# Patient Record
Sex: Male | Born: 1960 | Race: Black or African American | Hispanic: No | Marital: Single | Smoking: Never smoker
Health system: Southern US, Community
[De-identification: ages and names within clinical notes are randomized; demographics above are authoritative.]

---

## 2020-05-17 ENCOUNTER — Emergency Department (HOSPITAL_BASED_OUTPATIENT_CLINIC_OR_DEPARTMENT_OTHER): Payer: Self-pay

## 2020-05-17 ENCOUNTER — Other Ambulatory Visit: Payer: Self-pay

## 2020-05-17 ENCOUNTER — Encounter (HOSPITAL_BASED_OUTPATIENT_CLINIC_OR_DEPARTMENT_OTHER): Payer: Self-pay

## 2020-05-17 ENCOUNTER — Emergency Department (HOSPITAL_BASED_OUTPATIENT_CLINIC_OR_DEPARTMENT_OTHER)
Admission: EM | Admit: 2020-05-17 | Discharge: 2020-05-17 | Disposition: A | Discharge status: Home / Self Care | Payer: Self-pay | Attending: Emergency Medicine | Admitting: Emergency Medicine

## 2020-05-17 DIAGNOSIS — Z79899 Other long term (current) drug therapy: Secondary | ICD-10-CM | POA: Insufficient documentation

## 2020-05-17 DIAGNOSIS — M25512 Pain in left shoulder: Secondary | ICD-10-CM | POA: Insufficient documentation

## 2020-05-17 DIAGNOSIS — Z20822 Contact with and (suspected) exposure to covid-19: Secondary | ICD-10-CM

## 2020-05-17 DIAGNOSIS — U071 COVID-19: Secondary | ICD-10-CM | POA: Insufficient documentation

## 2020-05-17 DIAGNOSIS — W000XXA Fall on same level due to ice and snow, initial encounter: Secondary | ICD-10-CM | POA: Insufficient documentation

## 2020-05-17 DIAGNOSIS — S20212A Contusion of left front wall of thorax, initial encounter: Secondary | ICD-10-CM | POA: Insufficient documentation

## 2020-05-17 DIAGNOSIS — W19XXXA Unspecified fall, initial encounter: Secondary | ICD-10-CM

## 2020-05-17 DIAGNOSIS — R509 Fever, unspecified: Secondary | ICD-10-CM | POA: Insufficient documentation

## 2020-05-17 MED ORDER — ACETAMINOPHEN 325 MG PO TABS: 650.0000 mg | ORAL_TABLET | Freq: Once | ORAL | Status: DC

## 2020-05-17 MED ORDER — METHOCARBAMOL 500 MG PO TABS: 500.0000 mg | ORAL_TABLET | Freq: Two times a day (BID) | ORAL | 0 refills | Status: AC

## 2020-05-17 MED ORDER — IBUPROFEN 400 MG PO TABS
600.0000 mg | ORAL_TABLET | Freq: Once | ORAL | Status: AC
  Administered 2020-05-17: 600 mg via ORAL
  Filled 2020-05-17: qty 1

## 2020-05-17 MED ORDER — ACETAMINOPHEN 500 MG PO TABS
1000.0000 mg | ORAL_TABLET | Freq: Once | ORAL | Status: AC
  Administered 2020-05-17: 1000 mg via ORAL
  Filled 2020-05-17: qty 2

## 2020-05-17 NOTE — ED Notes (Signed)
RN Yvonna Alanis informed of pt temp 101.2

## 2020-05-17 NOTE — ED Notes (Signed)
PA at bedside.

## 2020-05-17 NOTE — Discharge Instructions (Addendum)
You have been diagnosed with a contusion/hematoma this is basically an area of swelling and pain after a fall.  Please drink plenty of water.  Take Tylenol and ibuprofen for pain as well as fever.  I have also provided you with a muscle relaxer to use as needed. Please follow up with a primary care doctor/office.   Your examination today is most concerning for a muscular injury 1. Medications: alternate ibuprofen and tylenol for pain control, take all usual home medications as they are prescribed 2. Treatment: rest, ice, elevate and use an ACE wrap or other compressive therapy to decrease swelling. Also drink plenty of fluids and do plenty of gentle stretching and move the affected muscle through its normal range of motion to prevent stiffness. 3. Follow Up: If your symptoms do not improve please follow up with orthopedics/sports medicine or your PCP for discussion of your diagnoses and further evaluation after today's visit; if you do not have a primary care doctor use the resource guide provided to find one; Please return to the ER for worsening symptoms or other concerns.

## 2020-05-17 NOTE — ED Provider Notes (Signed)
[facility] EMERGENCY DEPARTMENT Provider Note   CSN: [id number] Arrival date & time: 05/17/20  1734     History Chief Complaint  Patient presents with  . Fall    Jordan Leon is a 60 y.o. male.  HPI Patient is a 60 year old male presented today with left-sided rib cage pain, left shoulder pain after a fall that occurred yesterday evening when he was walking home from work he states that he was walking on black ice and his feet slipped forward underneath him and he fell backwards.  He states that he fell primarily onto his left thorax.  He states that there was no sharp objects or irregular surfaces that he fell upon.  He did not hit his head or lose consciousness.  He denies any back pain but states that he has severe achy nonradiating left-sided thorax pain that is constant worse with touch and turning and twisting.  He states that he works both at [employer] in [place] and he has had difficulty working because of the pain.  He denies any other symptoms but was noted to have a temperature of 101.2.  He states he has not been coughing he has not had any congestion nausea vomiting diarrhea he denies any headaches and states that he had no fatigue or weakness contributing to his fall today.  He also denies any chest pain or shortness of breath.  No fatigue or malaise.  No chills.    History reviewed. No pertinent past medical history.  There are no problems to display for this patient.   History reviewed. No pertinent surgical history.     No family history on file.  Social History   Tobacco Use  . Smoking status: Never Smoker  . Smokeless tobacco: Never Used  Substance Use Topics  . Alcohol use: Yes    Comment: occ  . Drug use: Never    Home Medications Prior to Admission medications   Medication Sig Start Date End Date Taking? Authorizing Provider  methocarbamol (ROBAXIN) 500 MG tablet Take 1 tablet (500 mg total) by mouth 2 (two) times daily. 05/17/20   Yes Gailen Shelter, PA    Allergies    Patient has no known allergies.  Review of Systems   Review of Systems  Constitutional: Negative for chills and fever (Specifically denies).  HENT: Negative for congestion.   Eyes: Negative for pain.  Respiratory: Negative for cough and shortness of breath.   Cardiovascular: Negative for chest pain and leg swelling.  Gastrointestinal: Negative for abdominal pain and vomiting.  Genitourinary: Negative for dysuria.  Musculoskeletal: Negative for myalgias.       Left-sided rib cage pain and left shoulder pain  Skin: Negative for rash.  Neurological: Negative for dizziness and headaches.    Physical Exam Updated Vital Signs BP 134/80 (BP Location: Right Arm)   Pulse 72   Temp (!) 101.2 F (38.4 C) (Oral)   Resp 14   Ht 5\' 9"  (1.753 m)   Wt 63.5 kg   SpO2 95%   BMI 20.67 kg/m   Physical Exam Vitals and nursing note reviewed.  Constitutional:      General: He is not in acute distress.    Appearance: Normal appearance. He is not ill-appearing.  HENT:     Head: Normocephalic and atraumatic.  Eyes:     General: No scleral icterus.       Right eye: No discharge.        Left eye: No discharge.  Conjunctiva/sclera: Conjunctivae normal.  Pulmonary:     Effort: Pulmonary effort is normal.     Breath sounds: No stridor.  Skin:    Comments: There is tenderness to palpation diffusely along the left lateral rib cage approximately ribs #5-7.  No significant bruising.  There is also some diffuse left shoulder tenderness to palpation over the musculature.  Full range of motion of bilateral grip strength 5/5.  No other bony tenderness over joints or long bones of the upper and lower extremities.     No neck or back midline tenderness, step-off, deformity, or bruising. Able to turn head left and right 45 degrees without difficulty.  Full range of motion of upper and lower extremity joints shown after palpation was conducted; with 5/5  symmetrical strength in upper and lower extremities. No chest wall tenderness, no facial or cranial tenderness.   Patient has intact sensation grossly in lower and upper extremities. Intact patellar and ankle reflexes. Patient able to ambulate without difficulty.  Radial and DP pulses palpated BL.   Neurological:     Mental Status: He is alert and oriented to person, place, and time. Mental status is at baseline.     ED Results / Procedures / Treatments   Labs (all labs ordered are listed, but only abnormal results are displayed) Labs Reviewed  SARS CORONAVIRUS 2 (TAT 6-24 HRS)    EKG None  Radiology DG Ribs Unilateral W/Chest Left  Result Date: 05/17/2020 CLINICAL DATA:  Status post fall. EXAM: LEFT RIBS AND CHEST - 3+ VIEW COMPARISON:  None. FINDINGS: A radiopaque marker was placed at the site of the patient's pain. No fracture or other bone lesions are seen involving the ribs. There is no evidence of pneumothorax or pleural effusion. A trace amount of atelectasis is seen within the bilateral lung bases. Heart size and mediastinal contours are within normal limits. IMPRESSION: No rib fracture or other bone lesions are identified. Electronically Signed   By: Aram Candela M.D.   On: 05/17/2020 18:55   DG Shoulder Left  Result Date: 05/17/2020 CLINICAL DATA:  Status post fall. EXAM: LEFT SHOULDER - 2+ VIEW COMPARISON:  None. FINDINGS: There is no evidence of fracture or dislocation. There is no evidence of arthropathy or other focal bone abnormality. Soft tissues are unremarkable. IMPRESSION: Negative. Electronically Signed   By: Aram Candela M.D.   On: 05/17/2020 18:54   DG Hip Unilat W or Wo Pelvis 2-3 Views Left  Result Date: 05/17/2020 CLINICAL DATA:  Status post fall. EXAM: DG HIP (WITH OR WITHOUT PELVIS) 2-3V LEFT COMPARISON:  None. FINDINGS: There is no evidence of hip fracture or dislocation. There is no evidence of arthropathy or other focal bone abnormality. Multiple  radiopaque surgical clips are seen within the pelvis. IMPRESSION: Negative. Electronically Signed   By: Aram Candela M.D.   On: 05/17/2020 18:56    Procedures Procedures (including critical care time)  Medications Ordered in ED Medications  acetaminophen (TYLENOL) tablet 1,000 mg (1,000 mg Oral Given 05/17/20 2200)  ibuprofen (ADVIL) tablet 600 mg (600 mg Oral Given 05/17/20 2252)    ED Course  I have reviewed the triage vital signs and the nursing notes.  Pertinent labs & imaging results that were available during my care of the patient were reviewed by me and considered in my medical decision making (see chart for details).    MDM Rules/Calculators/A&P  Patient is 60 year old male presenting today after fall that occurred yesterday evening.  He states he slipped on ice.  Fell onto his left thorax.  Has reproducible tenderness to palpation of the left-sided thorax.  He had no symptoms prior to falling.  He was noted in triage to have a temperature of nine 99.7 on recheck was 101.2.  He is not tachycardic denies any symptoms of fevers, chills, nausea, vomiting, cough, congestion.  Overall is very well-appearing.  He states he is vaccinated x2 for COVID.  He received Tylenol for his fever and states that he feels that his rib cage pain is somewhat improved.  He is ambulatory and well-appearing at this time vital signs within normal limits apart from elevated temperature we will recheck prior to discharge.  We will provide patient with a dose of ibuprofen and recommend Tylenol and Profen at home for fever and pain as well as muscle relaxer.  Patient is overall very well-appearing.  No indication for further work-up at this time.  Final Clinical Impression(s) / ED Diagnoses Final diagnoses:  Fall, initial encounter  Fever, unspecified fever cause  Rib contusion, left, initial encounter  Suspected COVID-19 virus infection    Rx / DC Orders ED Discharge  Orders         Ordered    methocarbamol (ROBAXIN) 500 MG tablet  2 times daily        05/17/20 2245           Solon Augusta Ursa, [place] 05/17/20 2255    Vanetta Mulders, MD 05/19/20 1857

## 2020-05-17 NOTE — ED Triage Notes (Signed)
Pt states he slipped on ice/fell last night-pain to left shoulder, left rib area, left hip-ambulatory to ED-to triage in w/c

## 2020-05-18 LAB — SARS CORONAVIRUS 2 (TAT 6-24 HRS): SARS Coronavirus 2: POSITIVE — AB

## 2021-06-02 IMAGING — CR DG HIP (WITH OR WITHOUT PELVIS) 2-3V*L*
3 series · 3 of 3 positions shown · non-contrast
Comparison: None.

CLINICAL DATA: Status post fall.

EXAM:
DG HIP (WITH OR WITHOUT PELVIS) 2-3V LEFT

[t pelvis a.p.]
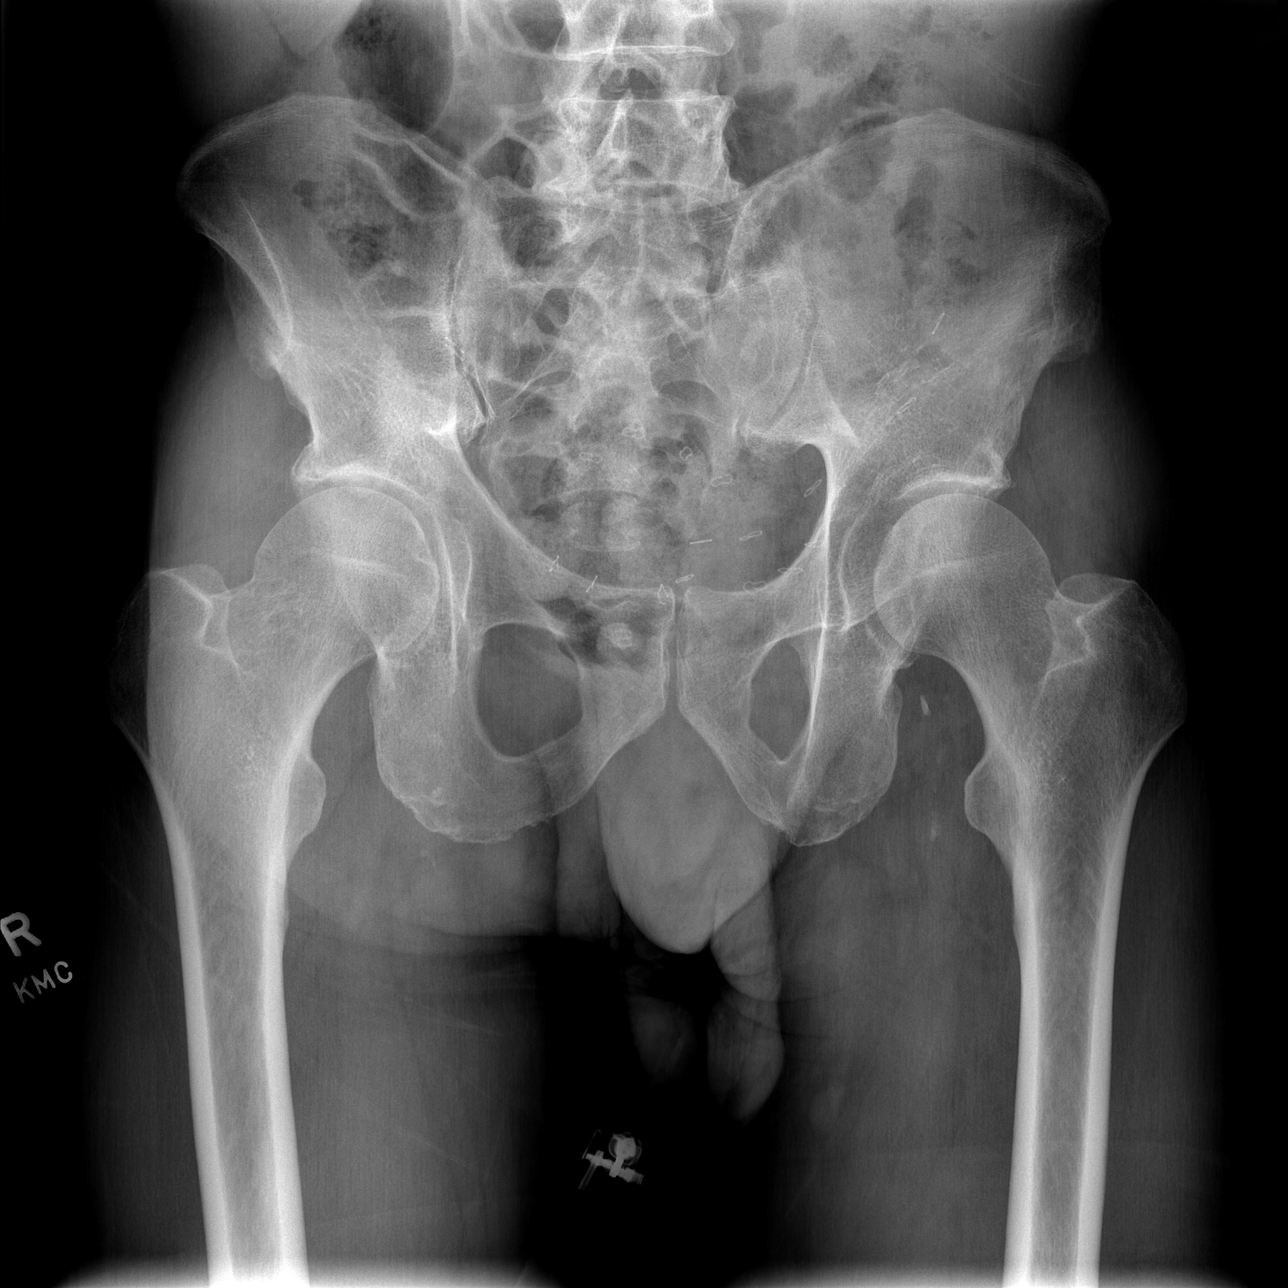

[t hip ap left]
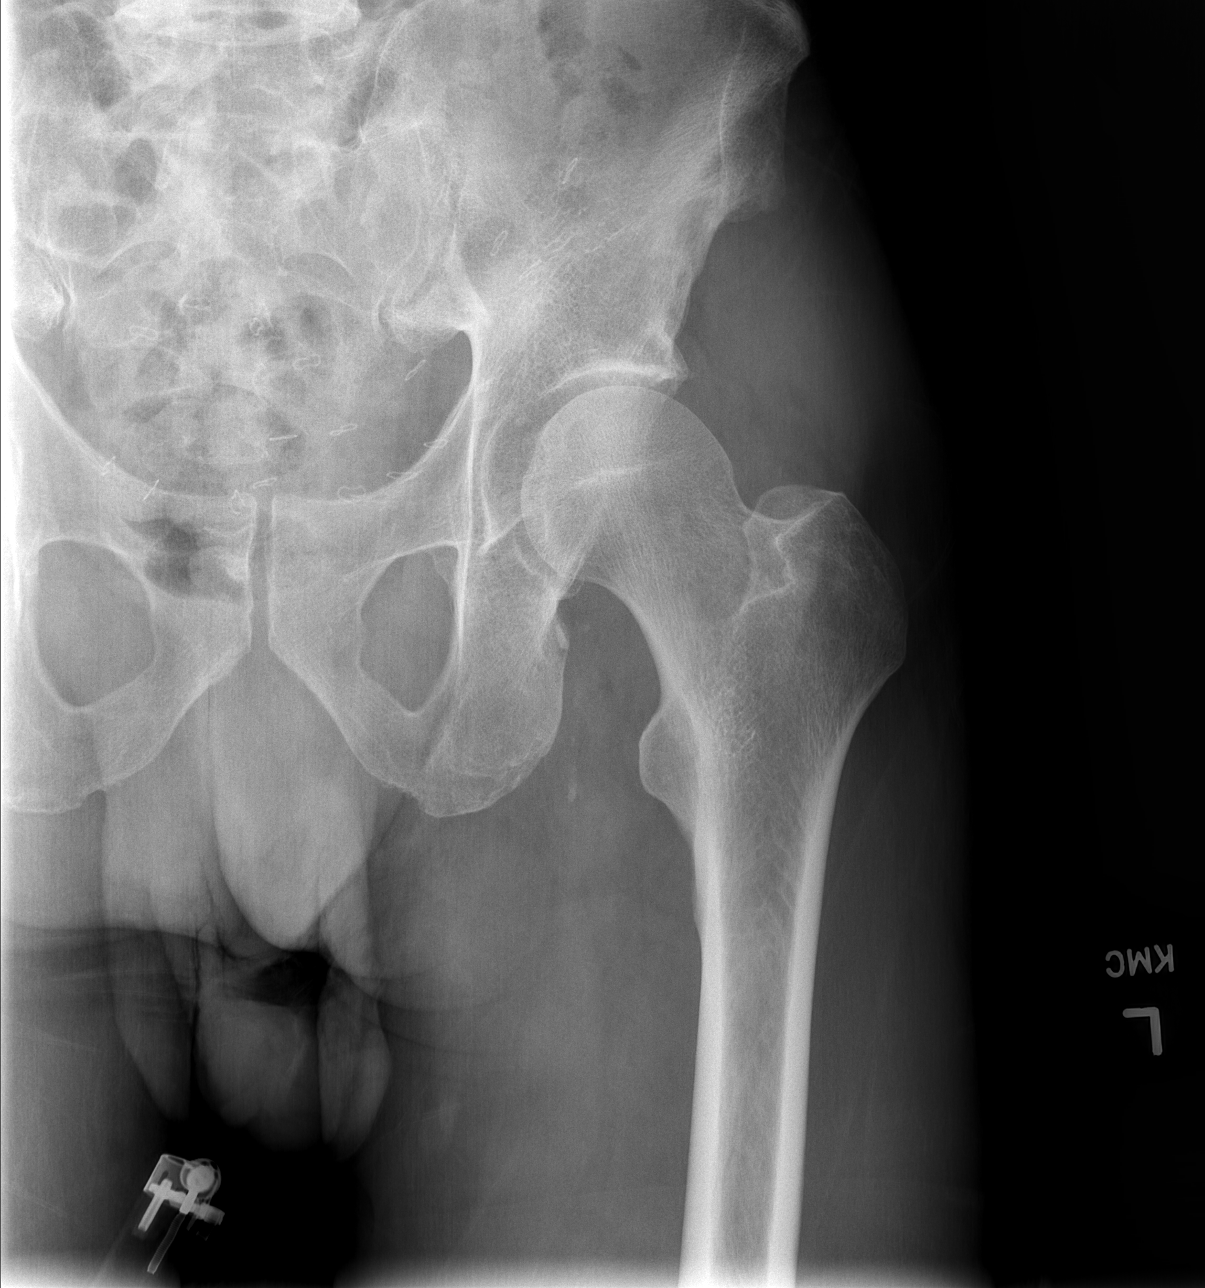

[t hip frog leg left]
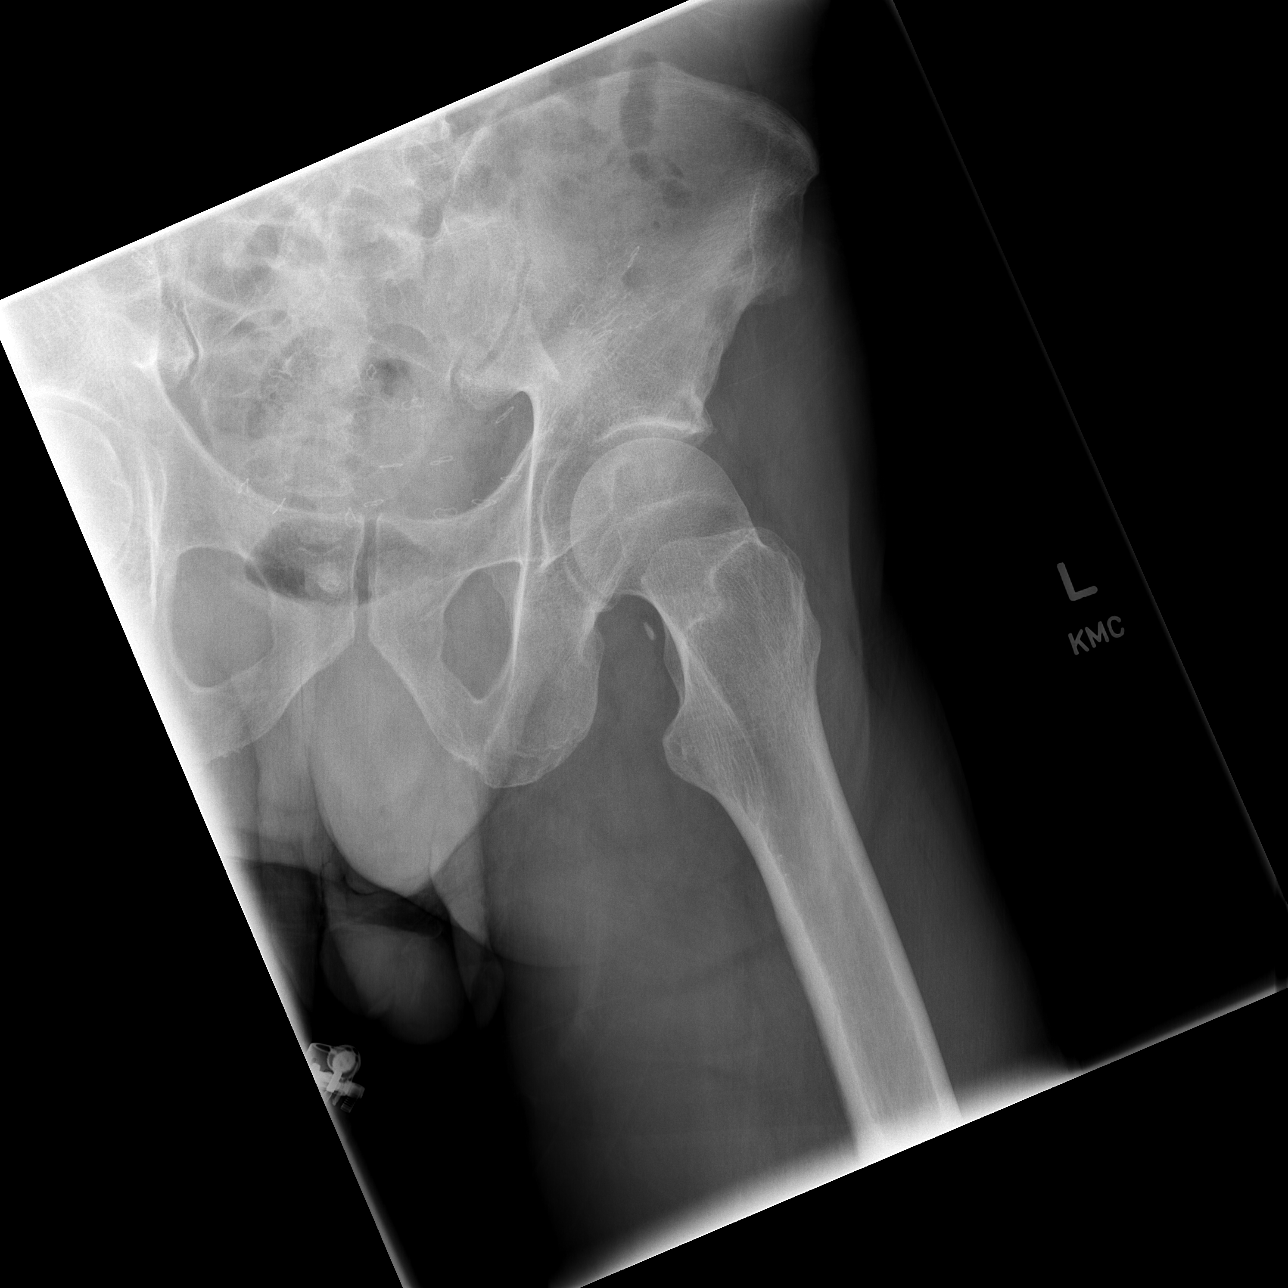

[3 of 3 positions shown; findings below may reference images not displayed]

FINDINGS: There is no evidence of hip fracture or dislocation. There is no
evidence of arthropathy or other focal bone abnormality. Multiple
radiopaque surgical clips are seen within the pelvis.
IMPRESSION: Negative.
